# Patient Record
Sex: Female | Born: 1965 | Race: Black or African American | Hispanic: No | Marital: Married | Smoking: Never smoker
Health system: Southern US, Community
[De-identification: ages and names within clinical notes are randomized; demographics above are authoritative.]

## PROBLEM LIST (undated history)

## (undated) DIAGNOSIS — M8580 Other specified disorders of bone density and structure, unspecified site: Secondary | ICD-10-CM

## (undated) HISTORY — DX: Other specified disorders of bone density and structure, unspecified site: M85.80

## (undated) HISTORY — PX: TUBAL LIGATION: SHX77

---

## 1998-10-31 ENCOUNTER — Other Ambulatory Visit: Admission: RE | Admit: 1998-10-31 | Discharge: 1998-10-31 | Payer: Self-pay | Admitting: Obstetrics and Gynecology

## 1999-11-03 ENCOUNTER — Other Ambulatory Visit: Admission: RE | Admit: 1999-11-03 | Discharge: 1999-11-03 | Payer: Self-pay | Admitting: Obstetrics and Gynecology

## 2000-11-15 ENCOUNTER — Other Ambulatory Visit: Admission: RE | Admit: 2000-11-15 | Discharge: 2000-11-15 | Payer: Self-pay | Admitting: Obstetrics and Gynecology

## 2001-11-15 ENCOUNTER — Other Ambulatory Visit: Admission: RE | Admit: 2001-11-15 | Discharge: 2001-11-15 | Payer: Self-pay | Admitting: Obstetrics and Gynecology

## 2003-01-23 ENCOUNTER — Inpatient Hospital Stay (HOSPITAL_COMMUNITY): Admission: AD | Admit: 2003-01-23 | Discharge: 2003-01-26 | Payer: Self-pay | Admitting: Obstetrics and Gynecology

## 2003-03-07 ENCOUNTER — Other Ambulatory Visit: Admission: RE | Admit: 2003-03-07 | Discharge: 2003-03-07 | Payer: Self-pay | Admitting: Obstetrics and Gynecology

## 2004-05-05 ENCOUNTER — Other Ambulatory Visit: Admission: RE | Admit: 2004-05-05 | Discharge: 2004-05-05 | Payer: Self-pay | Admitting: Obstetrics and Gynecology

## 2005-06-08 ENCOUNTER — Other Ambulatory Visit: Admission: RE | Admit: 2005-06-08 | Discharge: 2005-06-08 | Payer: Self-pay | Admitting: Obstetrics and Gynecology

## 2007-02-17 ENCOUNTER — Inpatient Hospital Stay (HOSPITAL_COMMUNITY): Admission: AD | Admit: 2007-02-17 | Discharge: 2007-02-17 | Payer: Self-pay | Admitting: Obstetrics and Gynecology

## 2007-03-25 ENCOUNTER — Encounter (INDEPENDENT_AMBULATORY_CARE_PROVIDER_SITE_OTHER): Payer: Self-pay | Admitting: Obstetrics and Gynecology

## 2007-03-25 ENCOUNTER — Inpatient Hospital Stay (HOSPITAL_COMMUNITY): Admission: RE | Admit: 2007-03-25 | Discharge: 2007-03-28 | Payer: Self-pay | Admitting: Obstetrics and Gynecology

## 2009-05-17 IMAGING — US US OB COMP +14 WK
1 series · 14 of 28 positions shown · non-contrast
Comparison: none

OBSTETRICAL ULTRASOUND:

 This ultrasound exam was performed in the [HOSPITAL] Ultrasound Department.  The OB US report was generated in the AS system, and faxed to the ordering physician.  This report is also available in [REDACTED] PACS.

[Series 1: us ob comp +14 wk · 14 of 34 slices shown]
[im 2/34]
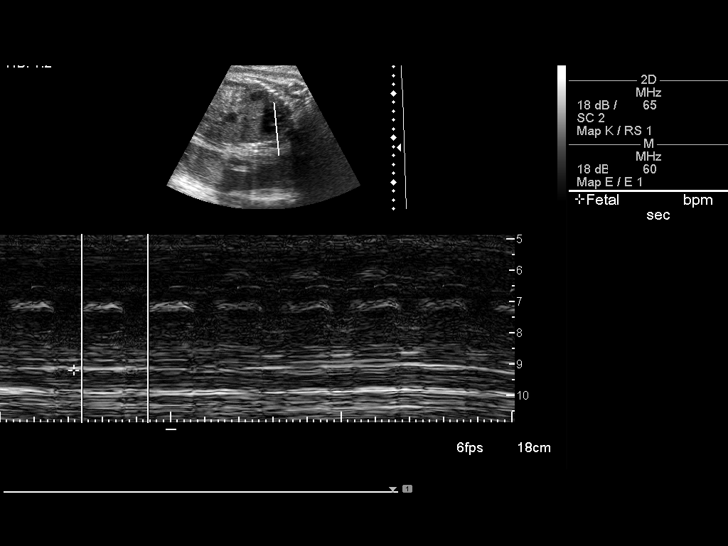
[im 4/34]
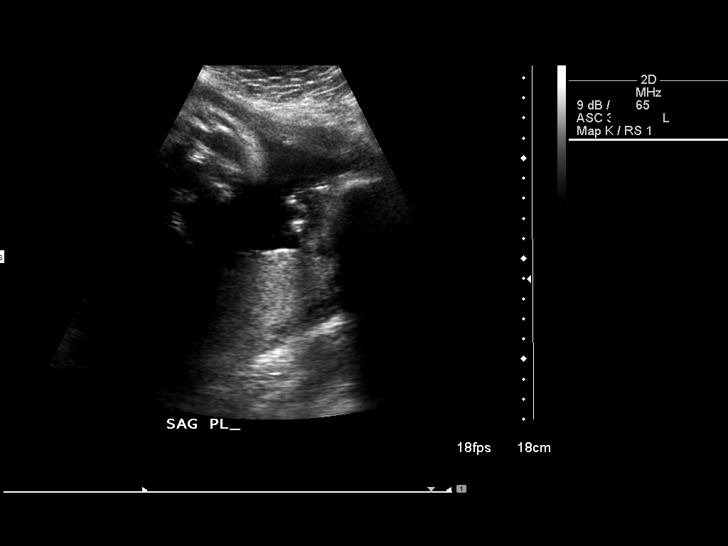
[im 7/34]
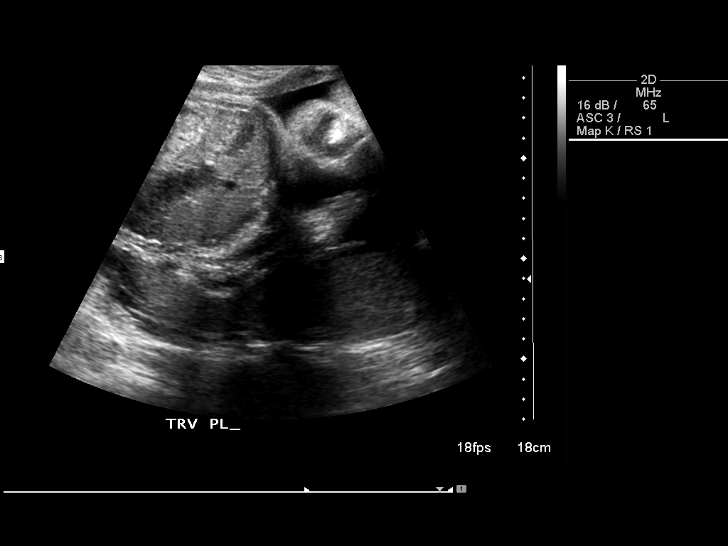
[im 9/34]
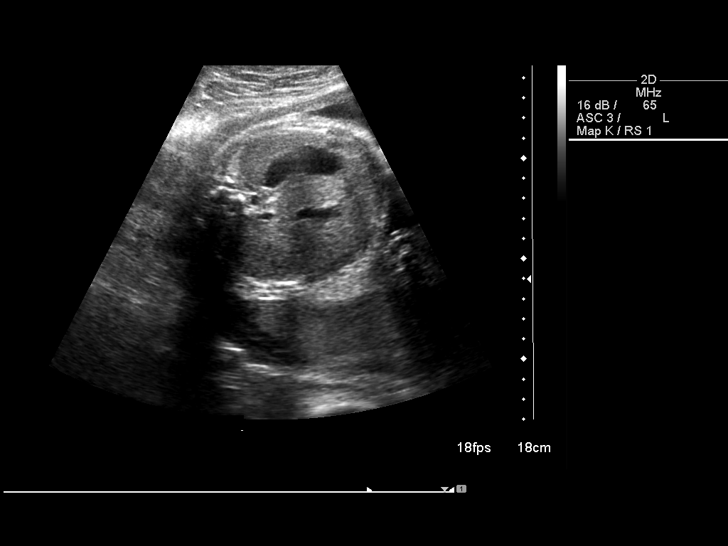
[im 12/34]
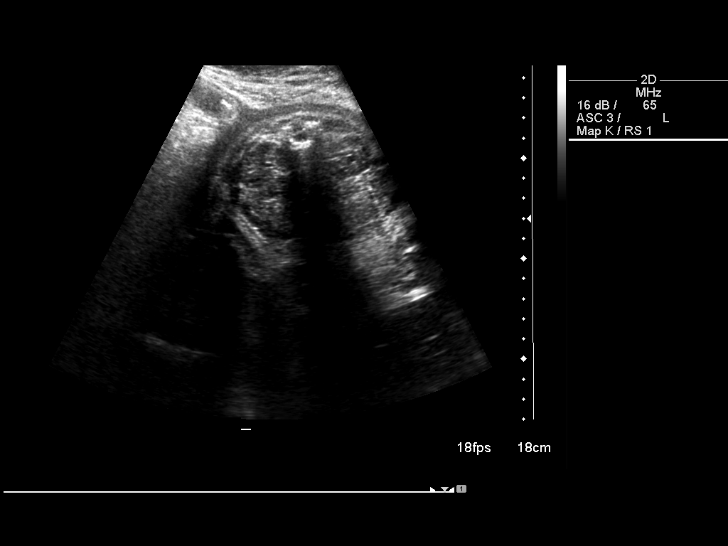
[im 14/34]
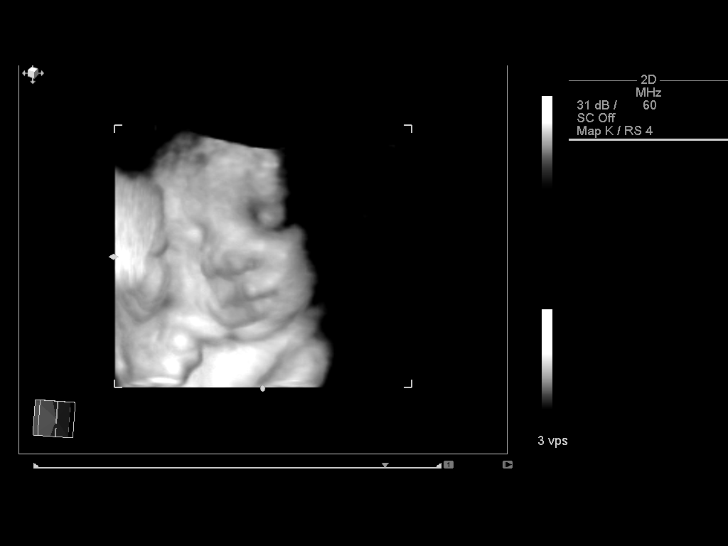
[im 16/34]
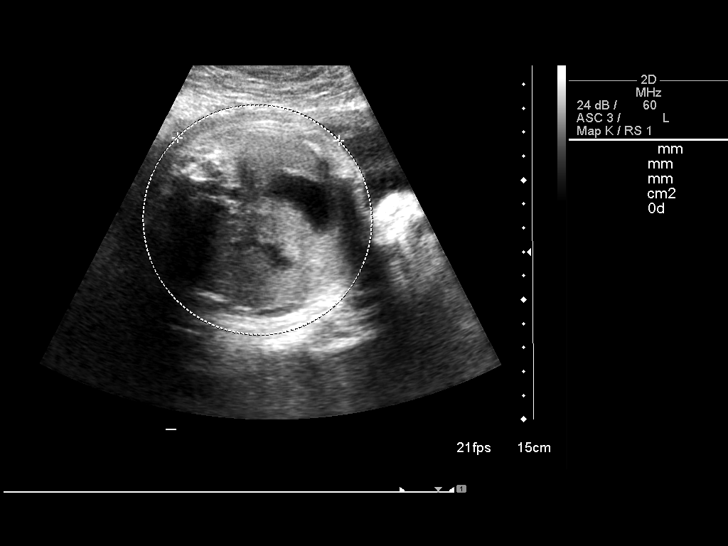
[im 19/34]
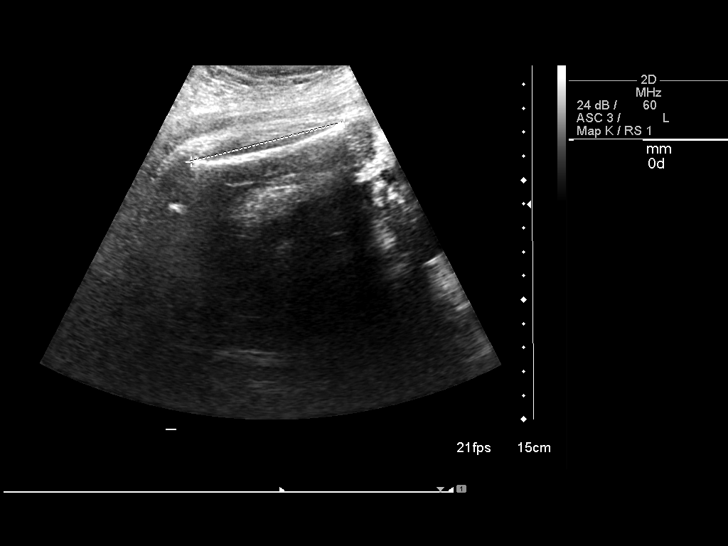
[im 21/34]
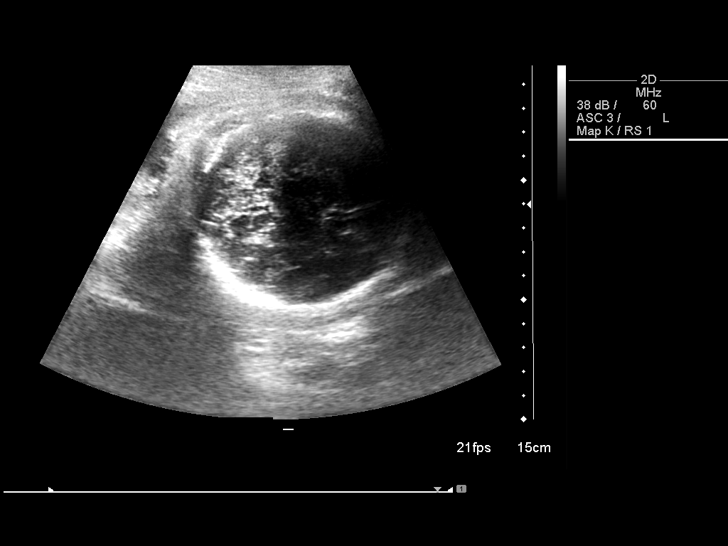
[im 24/34]
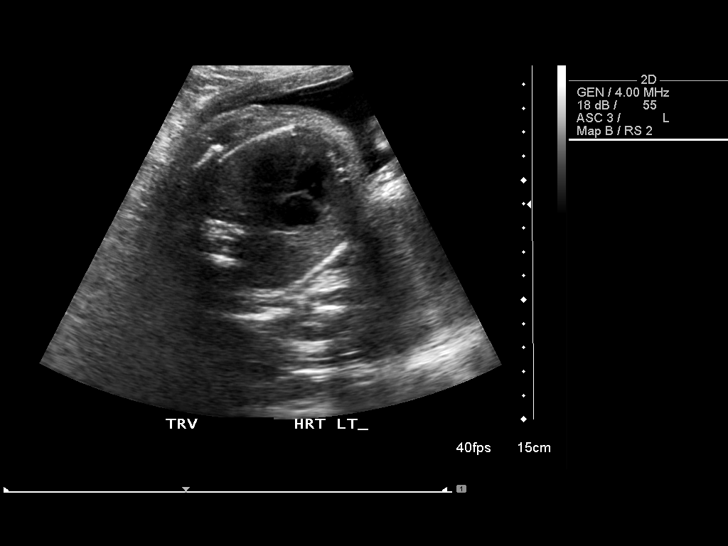
[im 26/34]
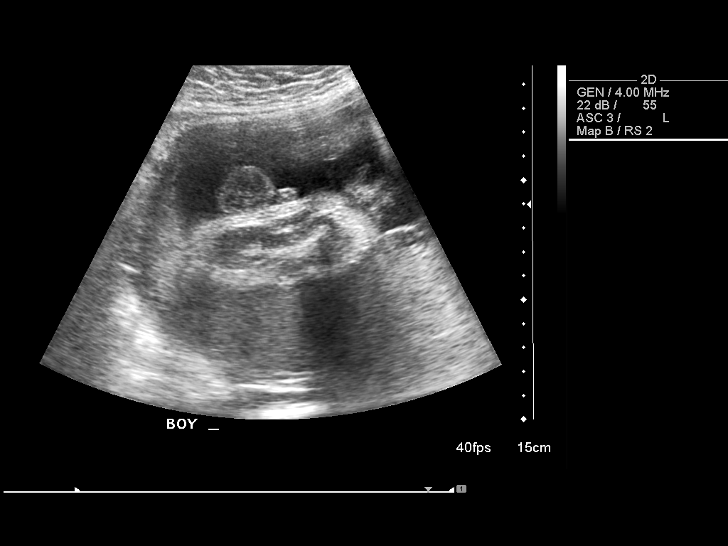
[im 29/34]
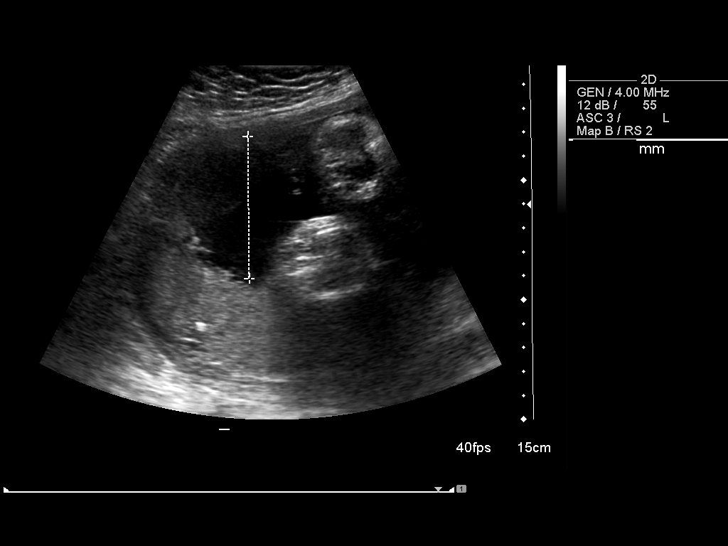
[im 31/34]
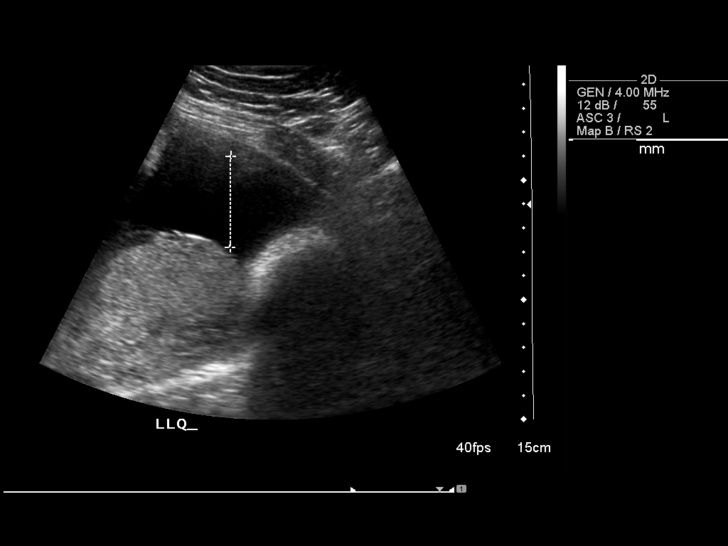
[im 34/34]
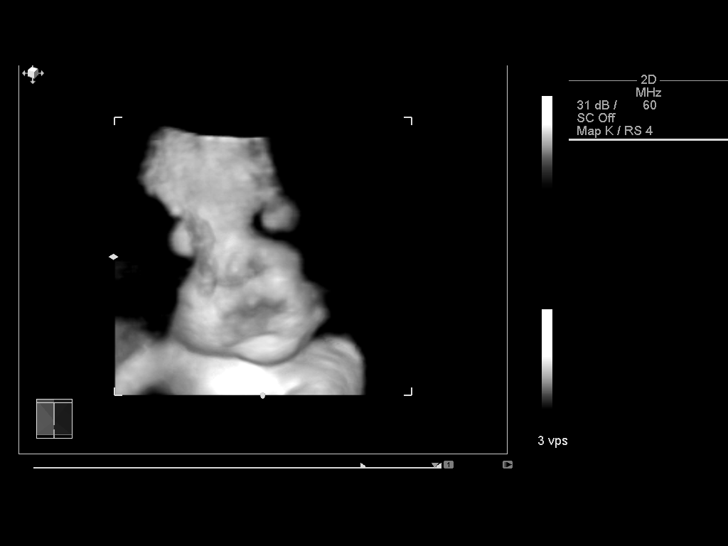

[14 of 28 positions shown; findings below may reference images not displayed]

IMPRESSION: See AS Obstetric US report.

## 2011-01-30 NOTE — Op Note (Signed)
NAME:  Pamela Phillips, Pamela Phillips                      ACCOUNT NO.:  [id number]   MEDICAL RECORD NO.:  [id number]                   PATIENT TYPE:  INP   LOCATION:  9136                                 FACILITY:  [facility]   PHYSICIAN:  Michelle L. Vincente Poli, M.D.            DATE OF BIRTH:  [birth year]   DATE OF PROCEDURE:  01/23/2003  DATE OF DISCHARGE:  01/26/2003                                 OPERATIVE REPORT   PREOPERATIVE DIAGNOSES:  1. Intrauterine pregnancy at 39-1/2 weeks.  2. Macrosomia.   POSTOPERATIVE DIAGNOSES:  1. Intrauterine pregnancy at 39-1/2 weeks.  2. Macrosomia.   PROCEDURE:  Primary low transverse cesarean section.   SURGEON:  Michelle L. Vincente Poli, M.D.   ANESTHESIA:  Spinal anesthesia.   ESTIMATED BLOOD LOSS:  500 mL.   FINDINGS:  Female infant with Apgars of 8 at one minute and nine at five  minutes weighing 9 pounds 7 ounces.   DESCRIPTION OF PROCEDURE:  The patient was taken to the operating room.  She  was given her spinal without incident.  The patient was prepped and draped  in the usual sterile fashion.  A Foley catheter was inserted into the  bladder.  A scalpel was used to make a low transverse incision and was  carried down to the fascia.  The fascia scored in the midline.  The  Pfannenstiel incision was then developed.  Peritoneum was entered bluntly  and the peritoneal incision was then stretched.  The bladder blade was  inserted.  The lower uterine segment was identified and the bladder flap was  then created sharply and then digitally.  The bladder blade was then  readjusted.  A low transverse incision was made in the uterus and extended  laterally.  The uterus was entered using a hemostat and amniotic fluid was  noted to be clear.  The baby was in cephalic presentation, a female infant  with Apgars 8 at one minute and 9 at five minutes weighing 9 pounds 7  ounces.  After the baby was delivered, the cord was clamped and cut.  The  baby was handed to the  awaiting pediatrician.  Cord blood was obtained.  The  placenta was manually removed and noted to be intact.  The uterus was  cleared of all clots and debris.  The uterine incision was closed with 0  Vicryl in continuous running locked stitch.  It was hemostatic.  The  peritoneum was closed using 0 Vicryl in continuous running stitch and the  rectus muscle was reapproximated using the same 0 Vicryl.  The fascia was  closed using 0 Vicryl in continuous running stitch starting at each corner  and meeting in the midline.  After irrigation of the subcutaneous layers,  skin was closed with staples.  All sponge, lap and instrument counts correct  x 2.  The patient tolerated the procedure well and went to the recovery room  in stable condition.  Michelle L. Vincente Poli, M.D.    Florestine Avers  D:  02/21/2003  T:  02/22/2003  Job:  [id number]

## 2011-01-30 NOTE — Op Note (Signed)
Pamela Phillips, Pamela Phillips            ACCOUNT NO.:  [id number]   MEDICAL RECORD NO.:  [id number]          PATIENT TYPE:  INP   LOCATION:  9104                          FACILITY:  [facility]   PHYSICIAN:  Michelle L. Grewal, M.D.DATE OF BIRTH:  01/27/1966   DATE OF PROCEDURE:  03/25/2007  DATE OF DISCHARGE:  02/17/2007                               OPERATIVE REPORT   PREOPERATIVE DIAGNOSIS:  Intrauterine pregnancy at term, previous  cesarean section, and desires permanent sterilization.   POSTOPERATIVE DIAGNOSIS:  Intrauterine pregnancy at term, previous  cesarean section, and desires permanent sterilization.   PROCEDURE:  Repeat low transverse cesarean section and bilateral tubal  ligation.   SURGEON:  Michelle L. Vincente Poli, M.D.   ANESTHESIA:  Spinal.   SPECIMENS:  Female infant, Apgar 8 at 1 minute and 9 at 5 minutes,  weighing 7 pounds 1 ounce.   ESTIMATED BLOOD LOSS:  500 mL.   PROCEDURE IN DETAIL:  The patient was taken to the operating room.  Her  spinal was dosed and found to be adequate.  She was prepped and draped  in the usual sterile fashion.  A low transverse incision was made in the  skin and carried down to the fascia.  The fascia was scored in the  midline and extended laterally.  The rectus muscles were separated in  the midline.  The peritoneum was entered bluntly.  The peritoneal  incision was then stretched.  The lower uterine segment was identified.  The bladder flap was created sharply and then digitally.  The bladder  blade was then readjusted. A low transverse incision was made in the  uterus.  The uterus was entered using a hemostat.  Amniotic fluid was  clear.  The baby was in cephalic presentation, was delivered easily with  the vacuum extractor.  The baby was a female infant, vigorous on the  abdomen, Apgars 8 at 1 minute, 9 at 5 minutes, and weighed 7 pounds 1  ounce. The cord was clamped and cut. The baby was handed to the awaiting  neonatal team and then taken  to the newborn nursery.  The placenta was  manually removed and noted be normal, intact, with three vessel cord.  The uterus was exteriorized and cleared of all clots and debris.  It was  closed in one layer using 0 chromic in a running locked stitch.   At this point, we performed a modified Pomeroy tubal ligation using  plain gut suture x2.  Each tubal segment was excised using Metzenbaum  scissors.  This was sent to pathology in separate containers.  Hemostasis was excellent.  The uterus was returned to the abdomen.  Irrigation was performed.  Hemostasis was again noted to be normal.  The  peritoneum was closed using 0 Vicryl in a running stitch.  The rectus  muscles were reapproximated using the same 0  Vicryl.  The fascia was closed using 0 Vicryl in a running lock stitch,  starting at each corner and meeting in the midline.  After irrigation of  the subcutaneous layer and noting hemostasis, the skin was closed with  staples.  All  sponge, lap and instrument counts were correct x2.  The  patient went to the recovery room in stable condition.      Michelle L. Vincente Poli, M.D.  Electronically Signed     MLG/MEDQ  D:  03/25/2007  T:  03/26/2007  Job:  [id number]

## 2011-01-30 NOTE — Discharge Summary (Signed)
NAMEKIRSTINE, Pamela Phillips            ACCOUNT NO.:  [id number]   MEDICAL RECORD NO.:  [id number]          PATIENT TYPE:  INP   LOCATION:  9104                          FACILITY:  [facility]   PHYSICIAN:  Duke Salvia. Marcelle Overlie, M.D.DATE OF BIRTH:  07-31-1966   DATE OF ADMISSION:  03/25/2007  DATE OF DISCHARGE:                               DISCHARGE SUMMARY   ADMISSION DIAGNOSES:  1. Intrauterine pregnancy at term.  2. Previous cesarean section desires repeat.  3. Multiparity desires permanent sterilization.   DISCHARGE DIAGNOSIS:  Status post low transverse cesarean section with a  viable female infant, and permanent sterilization.   REASON FOR ADMISSION:  Please see written H&P.   HOSPITAL COURSE:  The patient was a 45 year old African American married  female gravida 2, para 1 that presented to [facility]  for scheduled cesarean section.  The patient had had a previous cesarean  delivery and desired repeat.  Due to multiparity the patient has also  requested permanent sterilization.  On the morning of admission the  patient was taken to operating room where spinal anesthesia was  administered without difficulty.  A low transverse incision was made  with delivery of a viable female infant weighing 7 pounds 1 ounce with  Apgars of 8 at 1 and 9 at 5 minutes.  Bilateral tubal ligation was  performed without difficulty.  The patient tolerated procedure well and  taken to the recovery room in stable condition.  On postoperative day #1  the patient was without complaint.  Vital signs stable.  She is  afebrile.  Fundus firm and mildly tender.  Abdominal dressing was noted  to be clean, dry and intact.  Laboratory findings revealed hemoglobin of  11.0. Postoperative day #2 the patient was without complaint.  Vital  signs were stable.  She was afebrile.  Abdominal dressing had been  removed revealing an incision that is clean, dry and intact.  Fundus  remained firm and nontender.  She is  ambulating well and tolerating a  regular diet without complaints of nausea, vomiting. Postoperative day  #3 the patient was without complaint.  Vital signs were stable.  She is  afebrile.  Abdomen soft.  Fundus firm and nontender.  Incision was  clean, dry and intact.  Staples were intact staples were left in place.  Discharge instructions were reviewed and the patient was later  discharged home.   CONDITION ON DISCHARGE:  Good, diet regular as tolerated.   ACTIVITY:  No heavy lifting, no driving x2 weeks, no vaginal entry.   FOLLOW UP:  Patient to follow up in the office in to 3 days for staple  removal.  She is to call for temperature greater than 100 degrees,  persistent nausea, vomiting, heavy vaginal bleeding and/or redness or  drainage from incisional site.   DISCHARGE MEDICATIONS:  1. Percocet 5/325 #30 one p.o. q.4-6 hours.  2. Motrin 600 mg every 6 hours.  3. Prenatal vitamins one p.o. daily.  4. Colace one p.o. daily p.r.n.      Julio Sicks, N.P.      Richard M. Marcelle Overlie, M.D.  Electronically  Signed    CC/MEDQ  D:  03/28/2007  T:  03/28/2007  Job:  [id number]

## 2011-01-30 NOTE — Discharge Summary (Signed)
NAME:  Pamela Phillips, Pamela Phillips                      ACCOUNT NO.:  [id number]   MEDICAL RECORD NO.:  [id number]                   PATIENT TYPE:  INP   LOCATION:  9136                                 FACILITY:  [facility]   PHYSICIAN:  Freddy Finner, M.D.                DATE OF BIRTH:  1966/09/09   DATE OF ADMISSION:  01/23/2003  DATE OF DISCHARGE:  01/26/2003                                 DISCHARGE SUMMARY   ADMISSION DIAGNOSES:  1. Intrauterine pregnancy at 23 weeks estimated gestational age.  2. Macrosomia.   DISCHARGE DIAGNOSES:  1. Status post low transverse Cesarean section secondary to macrosomia.  2. Viable female infant.   PROCEDURE:  Primary low transverse Cesarean section.   REASON FOR ADMISSION:  Please see written H&P.   HOSPITAL COURSE:  The patient was a 45 year old primigravida that was  admitted to [facility] at 39-1/2 weeks estimated gestational  age for a scheduled Cesarean delivery.  Ultrasound had been performed with  estimated fetal weight of greater than 4100 grams.  Decision was made to  proceed with the scheduled primary Cesarean delivery.  On the morning of  admission, the patient was taken to the operating room where spinal  anesthesia was administered without difficulty.  A low transverse incision  was made with delivery of a viable female infant weighing 9 pounds 7 ounces  with Apgars of 8 at one minute and 9 at five minutes.  The patient tolerated  the procedure well and was taken to the recovery room in stable condition.  On postoperative day one, the patient had good return of bowel function,  abdomen was soft __________ .  Vital signs are stable.  Abdominal dressing  was clean, dry and intact.  Labs revealed hemoglobin of 10.1, platelet count  of 225,000, WBC count of 10.0.  On postoperative day two, the patient was  doing well.  She was ambulating without assistance, tolerating a regular  diet without complaints of nausea and vomiting.   Abdomen was soft, slightly  distended, and incision was clean, dry and intact, and staples were intact.  On postoperative day three, the patient was doing well, vital signs are  stable, she remained afebrile, __________ nontender, abdomen was soft, and  incision was clean, dry and intact.  Staples are removed and the patient was  discharged home.   CONDITION ON DISCHARGE:  Good.   DIET:  Regular as tolerated.   ACTIVITY:  No heavy lifting, no driving x2 weeks, no vaginal entry.   FOLLOW UP:  The patient is to follow up in the office in one to two weeks  for an incision check.  She is to call for temperature greater than 100  degrees, persistent nausea and vomiting, heavy vaginal bleeding, or any  redness or drainage from the incisional site.   DISCHARGE MEDICATIONS:  1. Percocet 5/325, #30, one p.o. every four to six hours p.r.n.  2.     Motrin 600 mg every six hours p.r.n.  3. Prenatal vitamins one p.o. daily.  4. Colace one p.o. daily p.r.n.     Julio Sicks, N.P.                        Freddy Finner, M.D.    CC/MEDQ  D:  02/27/2003  T:  02/27/2003  Job:  [id number]

## 2011-06-30 LAB — RPR: RPR Ser Ql: NONREACTIVE

## 2011-06-30 LAB — CBC
Hemoglobin: 11 — ABNORMAL LOW
Platelets: 236
RBC: 3.62 — ABNORMAL LOW
RDW: 14
WBC: 8.3

## 2012-07-12 ENCOUNTER — Other Ambulatory Visit: Payer: Self-pay | Admitting: Obstetrics and Gynecology

## 2013-07-24 ENCOUNTER — Other Ambulatory Visit: Payer: Self-pay | Admitting: Obstetrics and Gynecology

## 2014-02-09 ENCOUNTER — Encounter: Payer: Self-pay | Admitting: Family Medicine

## 2014-02-09 ENCOUNTER — Ambulatory Visit (INDEPENDENT_AMBULATORY_CARE_PROVIDER_SITE_OTHER): Payer: BC Managed Care – PPO | Admitting: Family Medicine

## 2014-02-09 VITALS — BP 94/61 | HR 80 | Resp 16 | Ht 66.0 in | Wt 167.0 lb

## 2014-02-09 DIAGNOSIS — M8580 Other specified disorders of bone density and structure, unspecified site: Secondary | ICD-10-CM

## 2014-02-09 DIAGNOSIS — M546 Pain in thoracic spine: Secondary | ICD-10-CM

## 2014-02-09 DIAGNOSIS — M899 Disorder of bone, unspecified: Secondary | ICD-10-CM

## 2014-02-09 DIAGNOSIS — M949 Disorder of cartilage, unspecified: Secondary | ICD-10-CM

## 2014-02-09 MED ORDER — DICLOFENAC SODIUM 75 MG PO TBEC: 75.0000 mg | DELAYED_RELEASE_TABLET | Freq: Two times a day (BID) | ORAL | Status: DC

## 2014-02-09 MED ORDER — VITAMIN D (ERGOCALCIFEROL) 1.25 MG (50000 UNIT) PO CAPS: ORAL_CAPSULE | ORAL | Status: AC

## 2014-02-09 MED ORDER — TIZANIDINE HCL 4 MG PO TABS: 4.0000 mg | ORAL_TABLET | Freq: Three times a day (TID) | ORAL | Status: AC | PRN

## 2014-02-09 MED ORDER — DICLOFENAC SODIUM 1 % TD GEL: 4.0000 g | Freq: Four times a day (QID) | TRANSDERMAL | Status: AC

## 2014-02-09 NOTE — Progress Notes (Signed)
   Subjective:    Patient ID: Pamela Phillips, female    DOB: July 18, 1966, 48 y.o.   MRN: [id number]  HPI  Pamela Phillips is here today complaining of back pain. She has been having this pain for a very long time. She attributes her pain to her large breasts  She wears a 34 DDD   She has lost weight over the years but her breast size never decreases.  She has taken anti-inflammatory medications as well as muscle relaxers over the years.  She has never seen a chiropractor because she is hesitant to get an adjustment.  She has been diagnosed with osteopenia so she is afraid to have anyone doing anything to her back.      Review of Systems  Constitutional: Negative for activity change, appetite change and fatigue.  Cardiovascular: Negative for chest pain, palpitations and leg swelling.  Musculoskeletal: Positive for back pain.  All other systems reviewed and are negative.    Past Medical History  Diagnosis Date  . Osteopenia      Past Surgical History  Procedure Laterality Date  . Tubal ligation    . Cesarean section       History   Social History Narrative   Marital Status: Married Technical brewer)    Children:  Sons Aggie Hacker and [place])   Pets: None    Living Situation: Lives with husband and sons.   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)     Tobacco Use/Exposure:  None   Alcohol Use:  None   Drug Use:  None   Diet:  Regular   Exercise:  Gym ([employer]) 2-3 x week    Hobbies: Reading, Bowling, Decorating              Family History  Problem Relation Age of Onset  . Diabetes Mother   . Hyperlipidemia Mother   . Diabetes Maternal Grandmother   . Colon cancer Paternal Grandmother   . Pancreatic cancer Paternal Grandfather      No Known Allergies      Objective:   Physical Exam  Nursing note and vitals reviewed. Constitutional: She appears well-nourished.  Neck: Normal range of motion.    Cardiovascular: Normal rate.   Musculoskeletal: Normal range of motion.       Thoracic back: She exhibits pain.  Skin: Skin is warm and dry.  Psychiatric: She has a normal mood and affect. Her behavior is normal. Judgment and thought content normal.      Assessment & Plan:    [name] was seen today for back pain.  Diagnoses and associated orders for this visit:  Back pain - diclofenac (VOLTAREN) 75 MG EC tablet; Take 1 tablet (75 mg total) by mouth 2 (two) times daily. - tiZANidine (ZANAFLEX) 4 MG tablet; Take 1 tablet (4 mg total) by mouth every 8 (eight) hours as needed for muscle spasms. - diclofenac sodium (VOLTAREN) 1 % GEL; Apply 4 g topically 4 (four) times daily. You may apply to two different areas of the body  Osteopenia - Vitamin D, Ergocalciferol, (DRISDOL) 50000 UNITS CAPS capsule; Take 1-2 capsules per week as directed

## 2014-02-09 NOTE — Patient Instructions (Signed)
1)  Back Pain - Try the diclofenac pills [max 2 per day IF you don't do the gel] vs diclofenac gel (Voltaren Gel) [max is 32 grams per day if you don't do the pills] and tizanidine along with Tylenol.  You might also like Tourist information centre manager (OTC).  I would also suggest a chiropractic adjustment.    2)  Osteoporosis - Vitamin D 100,000 per week in winter 50,000 per week the other time.  Consider Actonel (1 x per month pill)   3)  Breast Reduction - Contact Dr. Renaye Rakers office.      Risedronate tablets What is this medicine? RISEDRONATE (ris ED roe nate) reduces calcium loss from bones. It helps make healthy bone and to slow bone loss in patients with Paget's disease and osteoporosis. It may be used in others at risk for bone loss. This medicine may be used for other purposes; ask your health care provider or pharmacist if you have questions. COMMON BRAND NAME(S): Actonel What should I tell my health care provider before I take this medicine? They need to know if you have any of these conditions: -dental disease -esophagus, stomach, or intestine problems, like acid reflux or GERD -kidney disease -low blood calcium -problems sitting or standing for 30 minutes -trouble swallowing -an unusual or allergic reaction to risedronate, other medicines, foods, dyes, or preservatives -pregnant or trying to get pregnant -breast-feeding How should I use this medicine? You must take this medication exactly as directed or you will lower the amount of medicine you absorb into your body or you may cause your self harm. Take this medicine by mouth first thing in the morning, after you are up for the day. Do not eat or drink anything before you take this medicine. Swallow the tablets with a full glass (6 to 8 fluid ounces) of plain water. Do not take the tablets with any other drink. Do not chew or crush the tablet. After taking this medicine, do not eat breakfast, drink, or take any other medicines or vitamins for at least  30 minutes. Stand or sit up for at least 30 minutes after you take this medicine; do not lie down. Do not take your medicine more often than directed. Talk to your pediatrician regarding the use of this medicine in children. Special care may be needed. Overdosage: If you think you have taken too much of this medicine contact a poison control center or emergency room at once. NOTE: This medicine is only for you. Do not share this medicine with others. What if I miss a dose? If you miss a dose, do not take it later in the day. Take your normal dose the next morning. Do not take double or extra doses. What may interact with this medicine? -antacids like aluminum hydroxide or magnesium hydroxide -aspirin -calcium supplements -iron supplements -NSAIDs, medicines for pain and inflammation, like ibuprofen or naproxen -thyroid hormones -vitamins with minerals This list may not describe all possible interactions. Give your health care provider a list of all the medicines, herbs, non-prescription drugs, or dietary supplements you use. Also tell them if you smoke, drink alcohol, or use illegal drugs. Some items may interact with your medicine. What should I watch for while using this medicine? Visit your doctor or health care professional for regular check ups. It may be some time before you see the benefit from this medicine. Your doctor or health care professional may order blood tests and other tests to see how you are doing. You should make  sure you get enough calcium and vitamin D while you are taking this medicine, unless your doctor tells you not to. Discuss the foods you eat and the vitamins you take with your health care professional. Some people who take this medicine have severe bone, joint, and/or muscle pain. This medicine may also increase your risk for a broken thigh bone. Tell your doctor right away if you have pain in your upper leg or groin. Tell your doctor if you have any pain that does  not go away or that gets worse. What side effects may I notice from receiving this medicine? Side effects that you should report to your doctor as soon as possible: -allergic reactions such as skin rash or itching, hives, swelling of the face, lips, throat, or tongue -black or tarry stools -changes in vision -heartburn or stomach pain -jaw pain, especially after dental work -pain or difficulty when swallowing -redness, blistering, peeling, or loosening of the skin, including inside the mouth Side effects that usually do not require medical attention (report to your doctor if they continue or are bothersome): -bone, muscle, or joint pain -changes in taste -diarrhea or constipation -eye pain or itching -headache -nausea or vomiting -stomach gas or fullness This list may not describe all possible side effects. Call your doctor for medical advice about side effects. You may report side effects to FDA at 1-800-FDA-1088. Where should I keep my medicine? Keep out of the reach of children. Store at room temperature between 20 and 25 degrees C (68 and 77 degrees F). Throw away any unused medicine after the expiration date. NOTE: This sheet is a summary. It may not cover all possible information. If you have questions about this medicine, talk to your doctor, pharmacist, or health care provider.  2014, Elsevier/Gold Standard. (2012-07-22 16:21:37)  Risedronate weekly tablets (Actonel) What is this medicine? RISEDRONATE (ris ED roe nate) slows calcium loss from the bone. It helps to make normal healthy bone and to slow bone loss in people with Paget's disease and osteoporosis. It may also be used in others at risk for bone loss. This medicine may be used for other purposes; ask your health care provider or pharmacist if you have questions. COMMON BRAND NAME(S): Actonel What should I tell my health care provider before I take this medicine? They need to know if you have any of these  conditions: -esophageal, stomach, or intestinal problems, like acid reflux or GERD -dental disease -kidney disease -low blood calcium -problems sitting or standing for 30 minutes -trouble swallowing -an unusual or allergic reaction to risedronate, other medicines, foods, dyes, or preservatives -pregnant or trying to get pregnant -breast-feeding How should I use this medicine? You must take this medicine exactly as directly or you will lower the amount of the medicine that you absorb into your body or you may cause yourself harm. Take this medicine by mouth first thing in the morning, after you are up for the day. Do not eat or drink anything before you take this medicine. Swallow the tablet with a full glass (6 to 8 ounces) of plain water. Do not take this medicine with any other drink. Do not chew, crush, or let the tablet dissolve in your mouth. After taking this medicine, do not eat breakfast, drink, or take any other medicines or vitamins for at least 30 minutes. Stand or sit up for at least 30 minutes after you take this medicine; do not lie down. Take this medicine on the same day every week.  Do not take your medicine more often than directed. Talk to your pediatrician regarding the use of this medicine in children. Special care may be needed. Overdosage: If you think you have taken too much of this medicine contact a poison control center or emergency room at once. NOTE: This medicine is only for you. Do not share this medicine with others. What if I miss a dose? If you miss a dose, take the dose on the morning after you remember. Then take your next dose on your regular day of the week. Never take 2 tablets on the same day. Do not take double or extra doses. What may interact with this medicine? -aluminum hydroxide -antacids -aspirin -calcium supplements -drugs for inflammation like ibuprofen, naproxen, and others -iron supplements -magnesium supplements -vitamins with minerals This  list may not describe all possible interactions. Give your health care provider a list of all the medicines, herbs, non-prescription drugs, or dietary supplements you use. Also tell them if you smoke, drink alcohol, or use illegal drugs. Some items may interact with your medicine. What should I watch for while using this medicine? Visit your doctor or health care professional for regular check ups. It may be some time before you see the benefit from this medicine. Do not stop taking your medicine unless your doctor tells you to. Your doctor may order blood tests or other tests to see how you are doing. You should make sure that you get enough calcium and vitamin D while you are taking this medicine. Discuss the foods you eat and the vitamins you take with your health care professional. Some people who take this medicine have severe bone, joint, and/or muscle pain. This medicine may also increase your risk for a broken thigh bone. Tell your doctor right away if you have pain in your upper leg or groin. Tell your doctor if you have any pain that does not go away or that gets worse. What side effects may I notice from receiving this medicine? Side effects that you should report to your doctor as soon as possible: -allergic reactions such as skin rash or itching, hives, swelling of the face, lips, throat, or tongue -black or tarry stools -changes in vision -heartburn or stomach pain -jaw pain, especially after dental work -pain or difficulty when swallowing -redness, blistering, peeling, or loosening of the skin, including inside the mouth Side effects that usually do not require medical attention (report to your doctor if they continue or are bothersome): -bone, muscle, or joint pain -changes in taste -diarrhea or constipation -eye pain or itching -headache -nausea or vomiting -stomach gas or fullness This list may not describe all possible side effects. Call your doctor for medical advice about  side effects. You may report side effects to FDA at 1-800-FDA-1088. Where should I keep my medicine? Keep out of the reach of children. Store at room temperature between 20 and 25 degrees C (68 and 77 degrees F). Throw away any unused medication after the expiration date. NOTE: This sheet is a summary. It may not cover all possible information. If you have questions about this medicine, talk to your doctor, pharmacist, or health care provider.  2014, Elsevier/Gold Standard. (2012-07-22 16:22:41)

## 2014-08-21 ENCOUNTER — Other Ambulatory Visit: Payer: Self-pay | Admitting: Obstetrics and Gynecology

## 2014-08-22 LAB — CYTOLOGY - PAP

## 2015-09-24 ENCOUNTER — Inpatient Hospital Stay (HOSPITAL_COMMUNITY)
Admission: AD | Admit: 2015-09-24 | Discharge: 2015-09-25 | Disposition: A | Discharge status: Home / Self Care | Payer: BLUE CROSS/BLUE SHIELD | Source: Ambulatory Visit | Attending: Obstetrics and Gynecology | Admitting: Obstetrics and Gynecology

## 2015-09-24 DIAGNOSIS — N950 Postmenopausal bleeding: Secondary | ICD-10-CM | POA: Insufficient documentation

## 2015-09-24 DIAGNOSIS — M8580 Other specified disorders of bone density and structure, unspecified site: Secondary | ICD-10-CM | POA: Insufficient documentation

## 2015-09-24 DIAGNOSIS — N915 Oligomenorrhea, unspecified: Secondary | ICD-10-CM | POA: Insufficient documentation

## 2015-09-25 DIAGNOSIS — N950 Postmenopausal bleeding: Secondary | ICD-10-CM

## 2015-09-25 DIAGNOSIS — M8580 Other specified disorders of bone density and structure, unspecified site: Secondary | ICD-10-CM | POA: Diagnosis not present

## 2015-09-25 DIAGNOSIS — N915 Oligomenorrhea, unspecified: Secondary | ICD-10-CM | POA: Diagnosis not present

## 2015-09-25 LAB — CBC WITH DIFFERENTIAL/PLATELET
BASOS ABS: 0 10*3/uL (ref 0.0–0.1)
BASOS PCT: 1 %
EOS PCT: 4 %
Eosinophils Absolute: 0.2 10*3/uL (ref 0.0–0.7)
HEMATOCRIT: 37.1 % (ref 36.0–46.0)
Hemoglobin: 12.3 g/dL (ref 12.0–15.0)
Lymphocytes Relative: 33 %
Lymphs Abs: 2 10*3/uL (ref 0.7–4.0)
MCH: 29.4 pg (ref 26.0–34.0)
MCHC: 33.2 g/dL (ref 30.0–36.0)
MCV: 88.5 fL (ref 78.0–100.0)
MONO ABS: 0.4 10*3/uL (ref 0.1–1.0)
MONOS PCT: 7 %
Neutro Abs: 3.4 10*3/uL (ref 1.7–7.7)
Neutrophils Relative %: 56 %
PLATELETS: 317 10*3/uL (ref 150–400)
RBC: 4.19 MIL/uL (ref 3.87–5.11)
RDW: 14 % (ref 11.5–15.5)
WBC: 6 10*3/uL (ref 4.0–10.5)

## 2015-09-25 LAB — URINALYSIS, ROUTINE W REFLEX MICROSCOPIC
BILIRUBIN URINE: NEGATIVE
GLUCOSE, UA: NEGATIVE mg/dL
KETONES UR: NEGATIVE mg/dL
LEUKOCYTES UA: NEGATIVE
Nitrite: NEGATIVE
PH: 6 (ref 5.0–8.0)
PROTEIN: NEGATIVE mg/dL
Specific Gravity, Urine: 1.02 (ref 1.005–1.030)

## 2015-09-25 LAB — URINE MICROSCOPIC-ADD ON

## 2015-09-25 NOTE — Discharge Instructions (Signed)
Abnormal Uterine Bleeding °Abnormal uterine bleeding means bleeding from the vagina that is not your normal menstrual period. This can be: °· Bleeding or spotting between periods. °· Bleeding after sex (sexual intercourse). °· Bleeding that is heavier or more than normal. °· Periods that last longer than usual. °· Bleeding after menopause. °There are many problems that may cause this. Treatment will depend on the cause of the bleeding. Any kind of bleeding that is not normal should be reviewed by your doctor.  °HOME CARE °Watch your condition for any changes. These actions may lessen any discomfort you are having: °· Do not use tampons or douches as told by your doctor. °· Change your pads often. °You should get regular pelvic exams and Pap tests. Keep all appointments for tests as told by your doctor. °GET HELP IF: °· You are bleeding for more than 1 week. °· You feel dizzy at times. °GET HELP RIGHT AWAY IF:  °· You pass out. °· You have to change pads every 15 to 30 minutes. °· You have belly pain. °· You have a fever. °· You become sweaty or weak. °· You are passing large blood clots from the vagina. °· You feel sick to your stomach (nauseous) and throw up (vomit). °MAKE SURE YOU: °· Understand these instructions. °· Will watch your condition. °· Will get help right away if you are not doing well or get worse. °  °This information is not intended to replace advice given to you by your health care provider. Make sure you discuss any questions you have with your health care provider. °  °Document Released: 06/28/2009 Document Revised: 09/05/2013 Document Reviewed: 03/30/2013 °Elsevier Interactive Patient Education ©2016 Elsevier Inc. ° °

## 2015-09-25 NOTE — [facility] Provider Note (Signed)
History     CSN: [id number]  Arrival date and time: 09/24/15 2351   First Provider Initiated Contact with Patient 09/25/15 0018      Chief Complaint  Patient presents with  . Vaginal Bleeding   HPI Ms. Pamela Phillips is a 50 y.o. female who presents to [facility] today with complaint of post-menopausal bleeding. The patient states that she had BTL ~ 8 years ago and then has not had a period in ~ 6 years. She denies any previous episodes of PMB. She states that she had labs in the office confirming she was post menopausal. She was seen in the office on Wednesday for a pap smear. She states that spotting started earlier today and since around 8pm tonight she has noted an increase in bleeding similar to a light period. She states only very mild cramping. She has not taken any medication for pain. She denies fever, weakness, dizziness or fatigue.   OB History    No data available      Past Medical History  Diagnosis Date  . Osteopenia     Past Surgical History  Procedure Laterality Date  . Tubal ligation    . Cesarean section      Family History  Problem Relation Age of Onset  . Diabetes Mother   . Hyperlipidemia Mother   . Diabetes Maternal Grandmother   . Colon cancer Paternal Grandmother   . Pancreatic cancer Paternal Grandfather     Social History  Substance Use Topics  . Smoking status: Never Smoker   . Smokeless tobacco: Never Used  . Alcohol Use: Yes    Allergies: No Known Allergies  Prescriptions prior to admission  Medication Sig Dispense Refill Last Dose  . estradiol (CLIMARA - DOSED IN MG/24 HR) 0.05 mg/24hr patch Place 0.05 mg onto the skin once a week.   09/25/2015 at Unknown time  . progesterone (PROMETRIUM) 100 MG capsule Take 100 mg by mouth daily.   09/24/2015 at Unknown time  . diclofenac (VOLTAREN) 75 MG EC tablet Take 1 tablet (75 mg total) by mouth 2 (two) times daily. 60 tablet 5     Review of Systems  Constitutional: Negative for fever and  malaise/fatigue.  Gastrointestinal: Positive for abdominal pain.  Genitourinary:       + vaginal bleeding  Neurological: Negative for dizziness, loss of consciousness and weakness.   Physical Exam   There were no vitals taken for this visit.  Physical Exam  Nursing note and vitals reviewed. Constitutional: She is oriented to person, place, and time. She appears well-developed and well-nourished. No distress.  HENT:  Head: Normocephalic and atraumatic.  Cardiovascular: Normal rate.   Respiratory: Effort normal.  GI: Soft. She exhibits no distension and no mass. There is no tenderness. There is no rebound and no guarding.  Genitourinary: Uterus is not enlarged and not tender. Cervix exhibits no motion tenderness, no discharge and no friability. Right adnexum displays no mass and no tenderness. Left adnexum displays no mass and no tenderness. There is bleeding (small blood in the vaginal vault) in the vagina. No vaginal discharge found.  Neurological: She is alert and oriented to person, place, and time.  Skin: Skin is warm and dry. No erythema.  Psychiatric: She has a normal mood and affect.   Results for orders placed or performed during the hospital encounter of 09/24/15 (from the past 24 hour(s))  Urinalysis, Routine w reflex microscopic (not at [facility])     Status: Abnormal   Collection  Time: 09/25/15 12:05 AM  Result Value Ref Range   Color, Urine ORANGE (A) YELLOW   APPearance CLEAR CLEAR   Specific Gravity, Urine 1.020 1.005 - 1.030   pH 6.0 5.0 - 8.0   Glucose, UA NEGATIVE NEGATIVE mg/dL   Hgb urine dipstick LARGE (A) NEGATIVE   Bilirubin Urine NEGATIVE NEGATIVE   Ketones, ur NEGATIVE NEGATIVE mg/dL   Protein, ur NEGATIVE NEGATIVE mg/dL   Nitrite NEGATIVE NEGATIVE   Leukocytes, UA NEGATIVE NEGATIVE  Urine microscopic-add on     Status: Abnormal   Collection Time: 09/25/15 12:05 AM  Result Value Ref Range   Squamous Epithelial / LPF 0-5 (A) NONE SEEN   WBC, UA 0-5 0 - 5  WBC/hpf   RBC / HPF TOO NUMEROUS TO COUNT 0 - 5 RBC/hpf   Bacteria, UA FEW (A) NONE SEEN  CBC with Differential/Platelet     Status: None   Collection Time: 09/25/15 12:35 AM  Result Value Ref Range   WBC 6.0 4.0 - 10.5 K/uL   RBC 4.19 3.87 - 5.11 MIL/uL   Hemoglobin 12.3 12.0 - 15.0 g/dL   HCT 09.837.1 11.936.0 - 14.746.0 %   MCV 88.5 78.0 - 100.0 fL   MCH 29.4 26.0 - 34.0 pg   MCHC 33.2 30.0 - 36.0 g/dL   RDW 82.914.0 56.211.5 - 13.015.5 %   Platelets 317 150 - 400 K/uL   Neutrophils Relative % 56 %   Neutro Abs 3.4 1.7 - 7.7 K/uL   Lymphocytes Relative 33 %   Lymphs Abs 2.0 0.7 - 4.0 K/uL   Monocytes Relative 7 %   Monocytes Absolute 0.4 0.1 - 1.0 K/uL   Eosinophils Relative 4 %   Eosinophils Absolute 0.2 0.0 - 0.7 K/uL   Basophils Relative 1 %   Basophils Absolute 0.0 0.0 - 0.1 K/uL    [facility] Course  Procedures None  MDM UPT  UA, CBC today Discussed patient with Dr. Marcelle OverlieHolland. Agrees with plan for follow-up in the office for continued work-up for PMB  Assessment and Plan  A: Post menopausal bleeding  P: Discharge home Ibuprofen PRN for pain Bleeding precautions discussed Patient advised to follow-up with [facility]. Patient should call to make an appointment to be seen in the next few weeks Patient may return to [facility] as needed or if her condition were to change or worsen   Marny LowensteinJulie N Shayla Heming, PA-C  09/25/2015, 1:12 AM

## 2015-09-25 NOTE — [facility] Note (Signed)
Pt was diagnosed as post menopausal two years ago as she has not had a cycle in six years.  She had a tubal ligation eight years ago. Just had a pap smear/ GYN exam On Jan 4th with Dr. Vincente Poli.  She noticed vaginal bleeding at 1230 today and had spotting at that time.  Approx 8pm, the bleeding became a bit heavier like a normal cycle and cramping started.Marland Kitchen  She was concerned about the bleeding b/c she has not bled in so long.

## 2022-04-28 ENCOUNTER — Other Ambulatory Visit: Payer: Self-pay | Admitting: Obstetrics and Gynecology

## 2022-04-28 DIAGNOSIS — R928 Other abnormal and inconclusive findings on diagnostic imaging of breast: Secondary | ICD-10-CM

## 2022-05-07 ENCOUNTER — Ambulatory Visit
Admission: RE | Admit: 2022-05-07 | Discharge: 2022-05-07 | Disposition: A | Discharge status: Home / Self Care | Payer: BLUE CROSS/BLUE SHIELD | Source: Ambulatory Visit | Attending: Obstetrics and Gynecology | Admitting: Obstetrics and Gynecology

## 2022-05-07 ENCOUNTER — Ambulatory Visit
Admission: RE | Admit: 2022-05-07 | Discharge: 2022-05-07 | Disposition: A | Discharge status: Home / Self Care | Payer: BC Managed Care – PPO | Source: Ambulatory Visit | Attending: Obstetrics and Gynecology | Admitting: Obstetrics and Gynecology

## 2022-05-07 ENCOUNTER — Other Ambulatory Visit: Payer: Self-pay | Admitting: Obstetrics and Gynecology

## 2022-05-07 DIAGNOSIS — R928 Other abnormal and inconclusive findings on diagnostic imaging of breast: Secondary | ICD-10-CM

## 2022-05-07 DIAGNOSIS — N6320 Unspecified lump in the left breast, unspecified quadrant: Secondary | ICD-10-CM

## 2022-05-12 ENCOUNTER — Ambulatory Visit
Admission: RE | Admit: 2022-05-12 | Discharge: 2022-05-12 | Disposition: A | Discharge status: Home / Self Care | Payer: BC Managed Care – PPO | Source: Ambulatory Visit | Attending: Obstetrics and Gynecology | Admitting: Obstetrics and Gynecology

## 2022-05-12 DIAGNOSIS — N6320 Unspecified lump in the left breast, unspecified quadrant: Secondary | ICD-10-CM

## 2022-09-25 ENCOUNTER — Other Ambulatory Visit: Payer: Self-pay | Admitting: Obstetrics and Gynecology

## 2022-09-25 DIAGNOSIS — N6320 Unspecified lump in the left breast, unspecified quadrant: Secondary | ICD-10-CM

## 2022-11-17 ENCOUNTER — Other Ambulatory Visit: Payer: BC Managed Care – PPO

## 2022-11-19 ENCOUNTER — Ambulatory Visit
Admission: RE | Admit: 2022-11-19 | Discharge: 2022-11-19 | Disposition: A | Discharge status: Home / Self Care | Payer: BC Managed Care – PPO | Source: Ambulatory Visit | Attending: Obstetrics and Gynecology | Admitting: Obstetrics and Gynecology

## 2022-11-19 DIAGNOSIS — N6320 Unspecified lump in the left breast, unspecified quadrant: Secondary | ICD-10-CM

## 2024-01-26 ENCOUNTER — Other Ambulatory Visit: Payer: Self-pay | Admitting: Plastic Surgery

## 2024-01-28 LAB — SURGICAL PATHOLOGY
